# Patient Record
Sex: Female | Born: 1975 | Hispanic: Yes | Marital: Married | Smoking: Never smoker
Health system: Southern US, Community
[De-identification: ages and names within clinical notes are randomized; demographics above are authoritative.]

---

## 2005-06-27 ENCOUNTER — Ambulatory Visit: Payer: Self-pay | Admitting: Family Medicine

## 2005-11-28 ENCOUNTER — Ambulatory Visit: Payer: Self-pay | Admitting: Family Medicine

## 2005-11-28 ENCOUNTER — Inpatient Hospital Stay: Payer: Self-pay | Admitting: Obstetrics and Gynecology

## 2007-03-31 IMAGING — US US OB US >=[ID] SNGL FETUS
1 series · 17 of 28 positions shown · non-contrast
Comparison: none

REASON FOR EXAM: dates and anatomy pt at 16 wks gestation  interpreter
office informed
COMMENTS:

[Series 1: us ob us >=(id) sngl fetus · 17 of 84 slices shown]
[im 1/84]
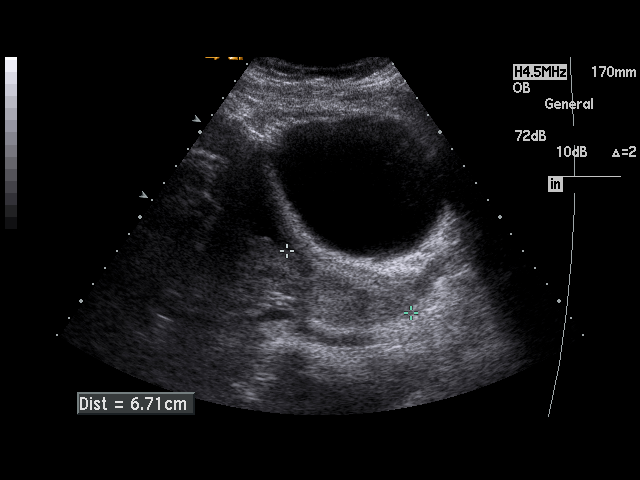
[im 7/84]
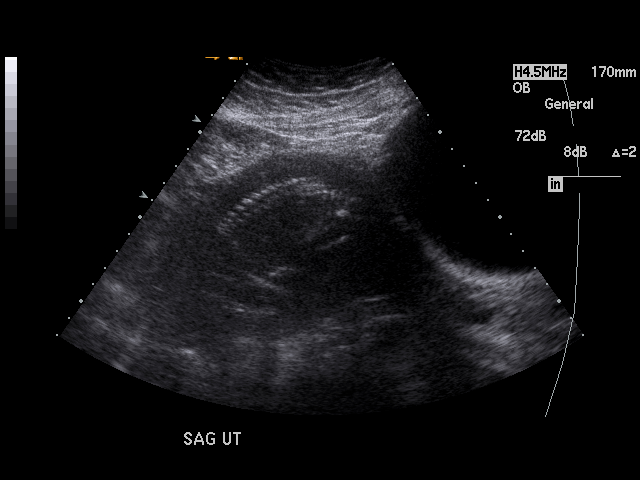
[im 13/84]
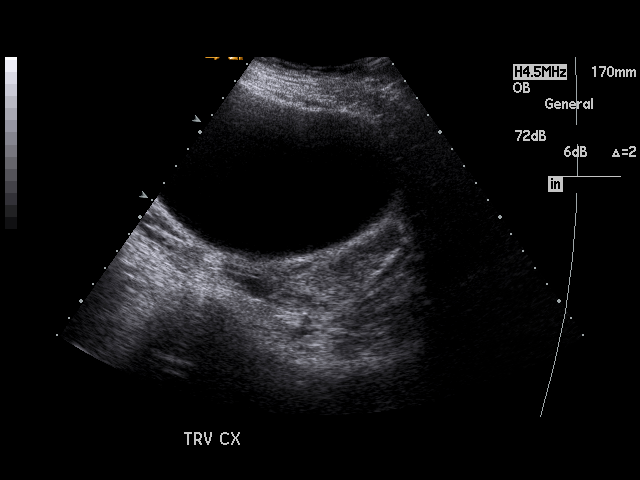
[im 16/84]
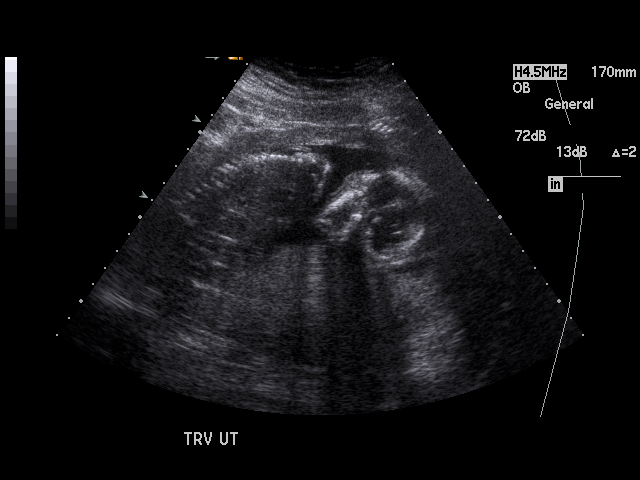
[im 22/84]
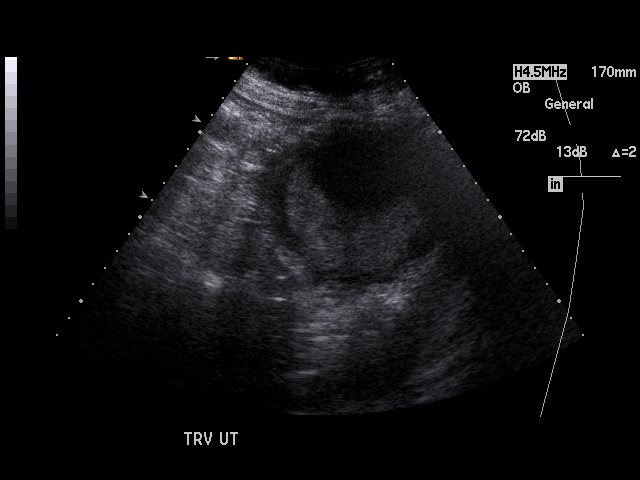
[im 28/84]
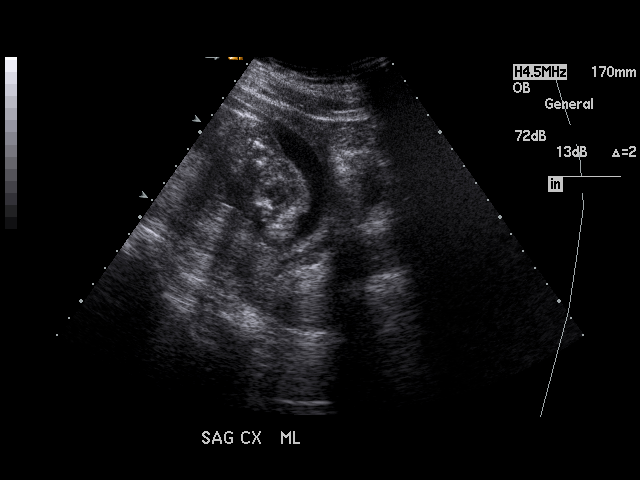
[im 31/84]
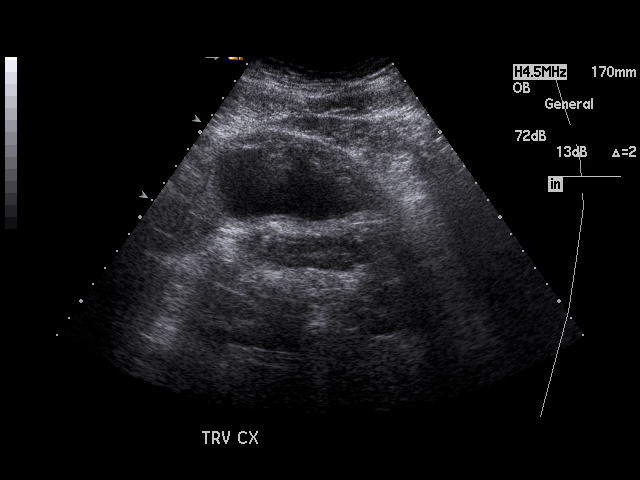
[im 37/84]
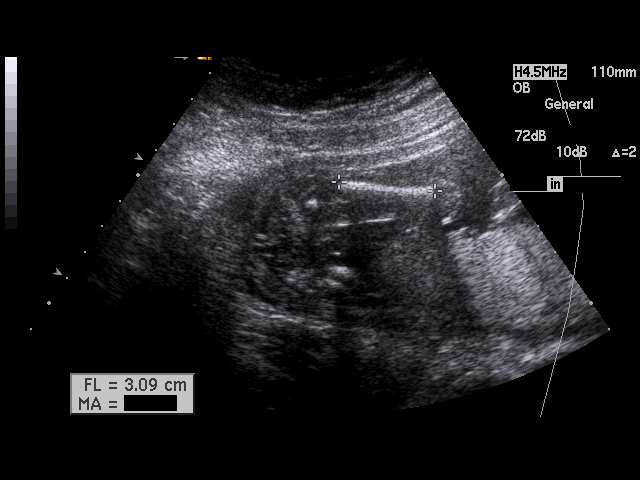
[im 44/84]
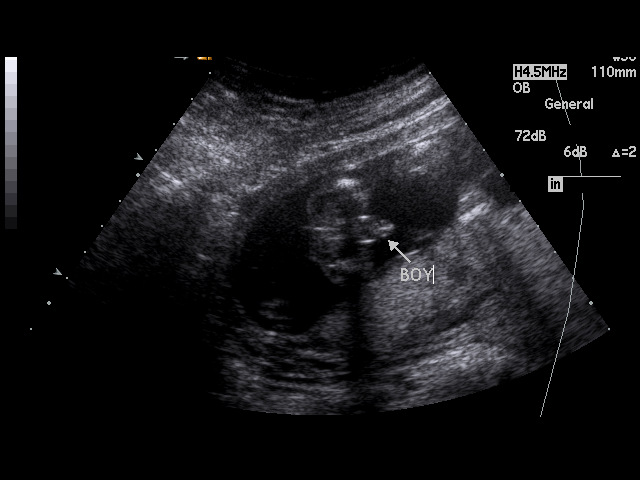
[im 47/84]
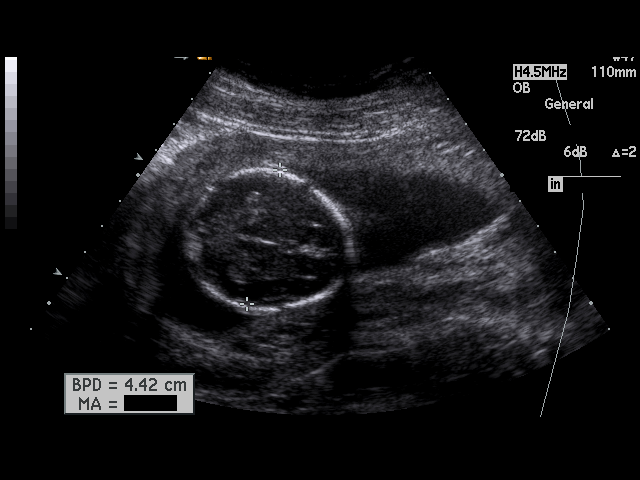
[im 53/84]
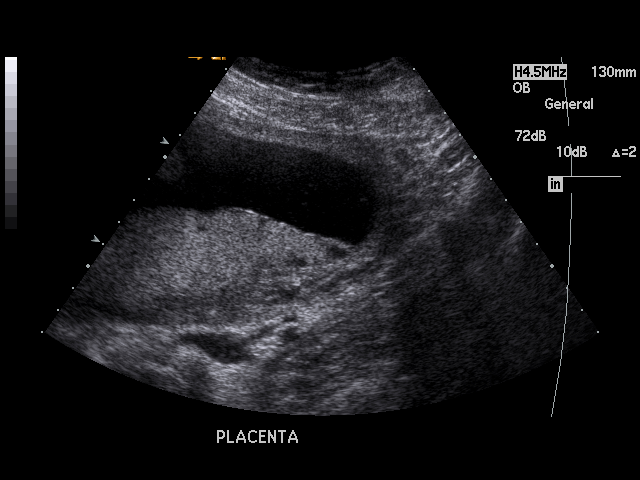
[im 56/84]
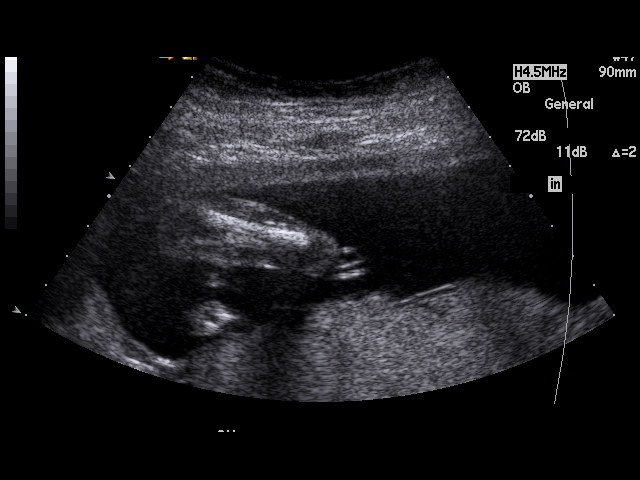
[im 62/84]
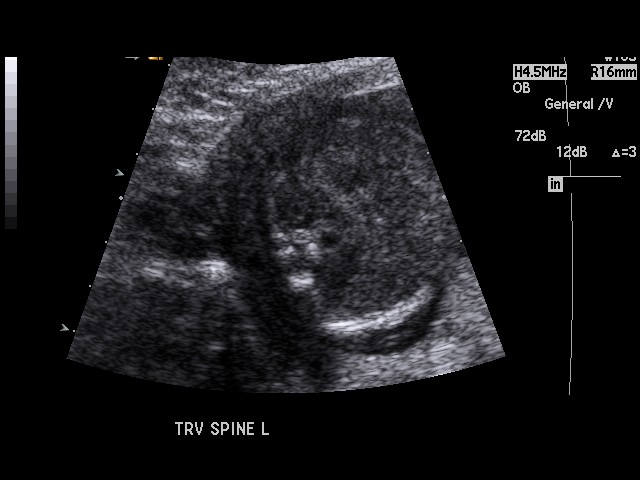
[im 68/84]
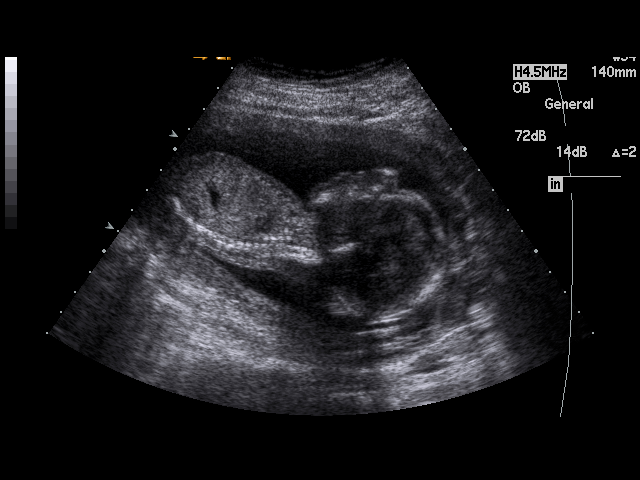
[im 71/84]
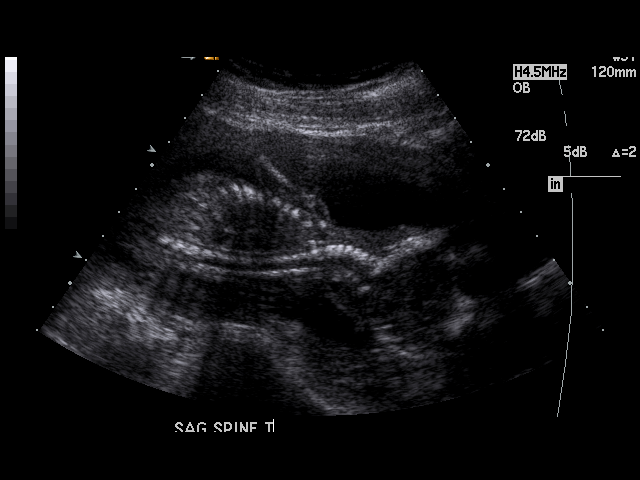
[im 77/84]
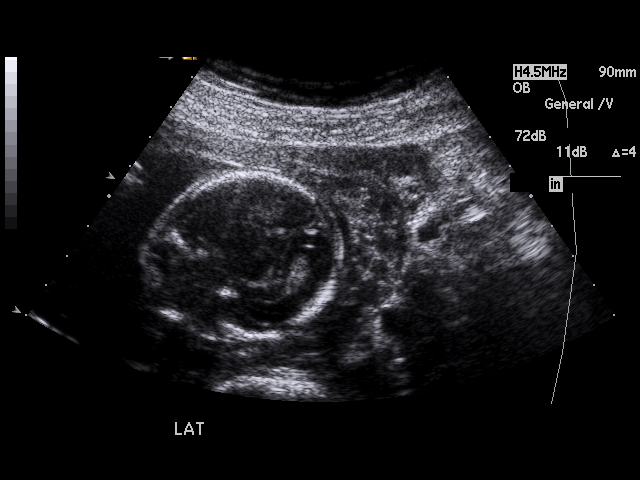
[im 84/84]
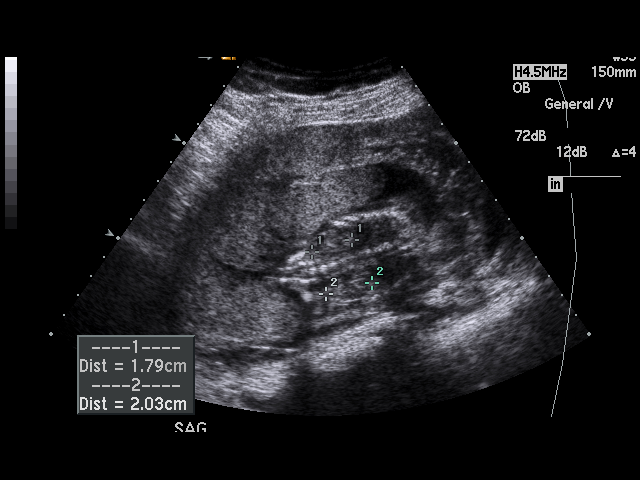

[17 of 28 positions shown; findings below may reference images not displayed]

PROCEDURE:     US  - US OB GREATER/OR EQUAL TO 0W01K  - June 27, 2005  [DATE]

RESULT:     Real time imaging was obtained. A single viable intrauterine
fetus in vertex presentation is identified. Fetal motion and fetal cardiac
motion is observed. Fetal heart rate is 138 beats per minute.  Utilizing
various growth parameters the average ultrasound age is 19 weeks-7 days.

BPD: 4.42 cm (19 weeks-9 days)
HC: 16.83 cm (19 weeks-9 days)
AC: 13.89 cm (19 weeks-7 days)
FL: 2.95 cm (19 weeks)

Fetal bladder, kidneys, stomach, four chambered heart and diaphragm are
identified.  The amniotic fluid appears within normal limits.

The placenta is located posteriorly and does not extend into the lower
uterine segment.
IMPRESSION: 1)Single viable intrauterine fetus in vertex presentation with average
ultrasound age of 19 weeks-7 days.  Heart rate of 138 beats per minute is
noted.

## 2007-09-01 IMAGING — US US OB FOLLOW-UP
1 series · 17 of 28 positions shown · non-contrast
Comparison: none

REASON FOR EXAM: Post date check AFI (interpreter office informed)
COMMENTS:

[Series 1: us ob follow-up · 17 of 28 slices shown]
[im 1/28]
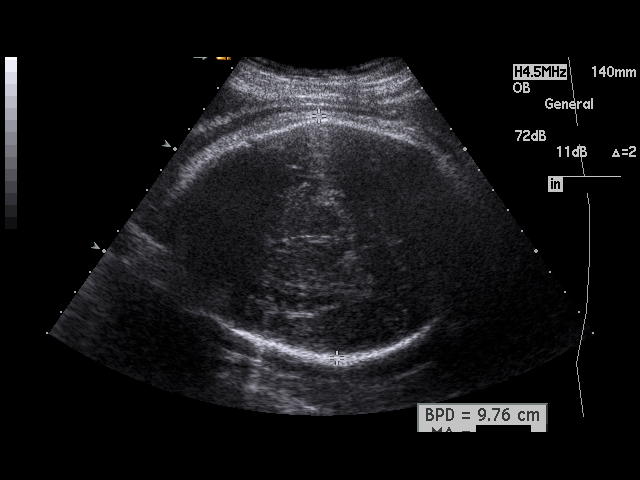
[im 3/28]
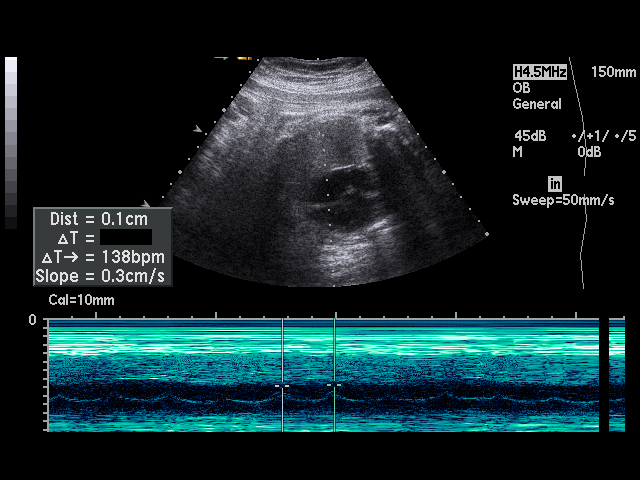
[im 5/28]
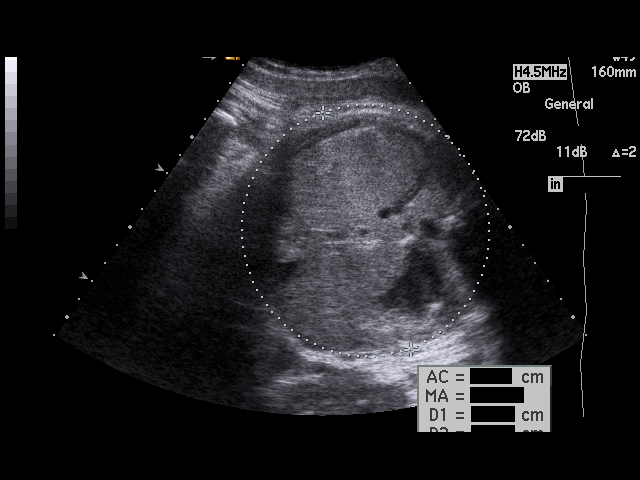
[im 6/28]
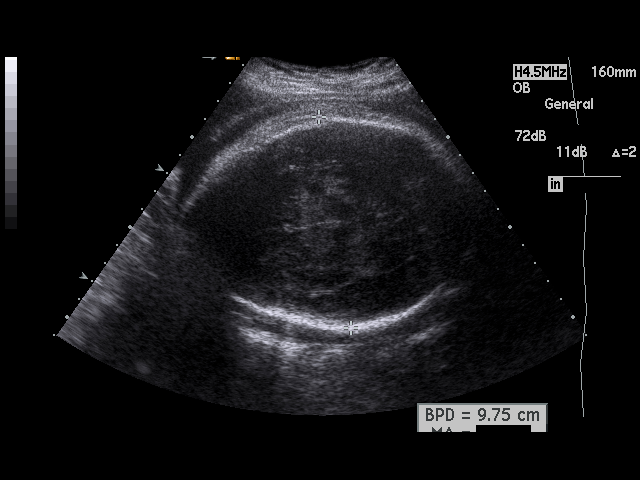
[im 8/28]
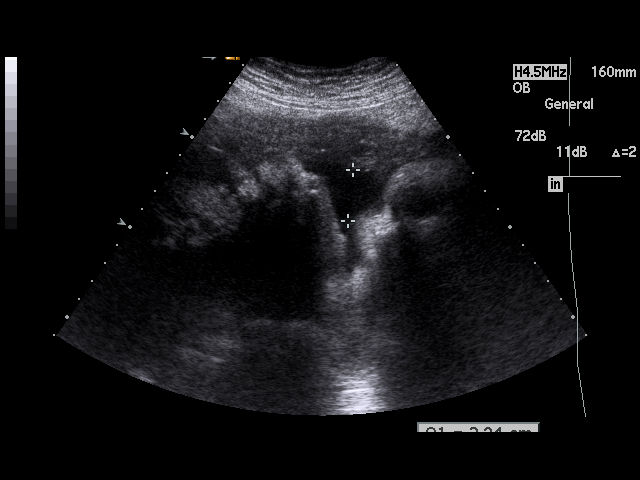
[im 10/28]
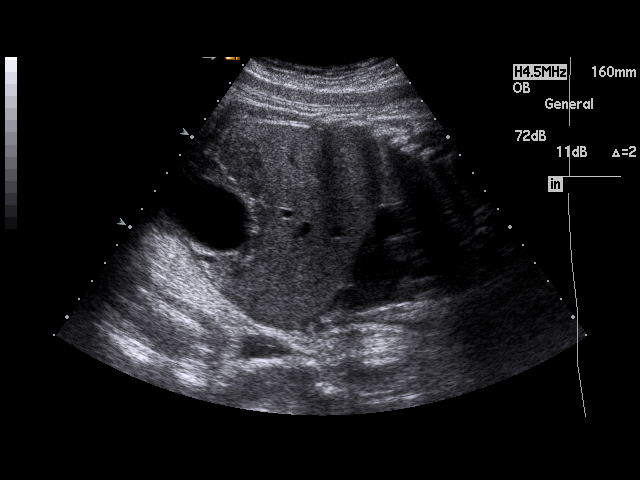
[im 11/28]
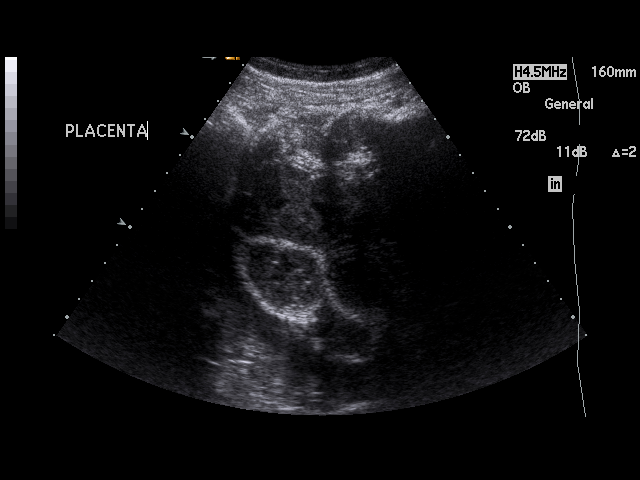
[im 13/28]
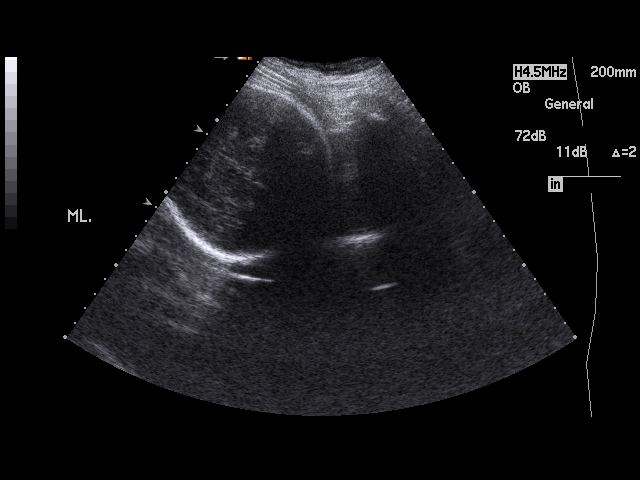
[im 15/28]
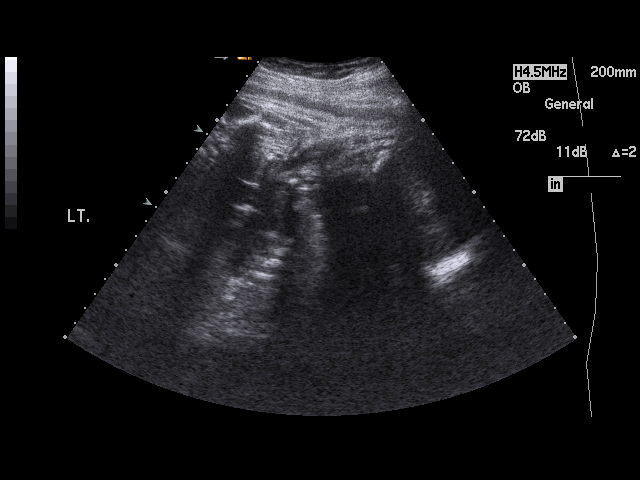
[im 16/28]
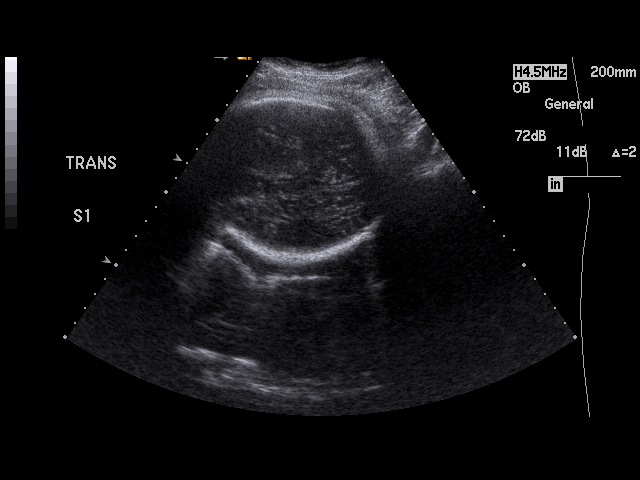
[im 18/28]
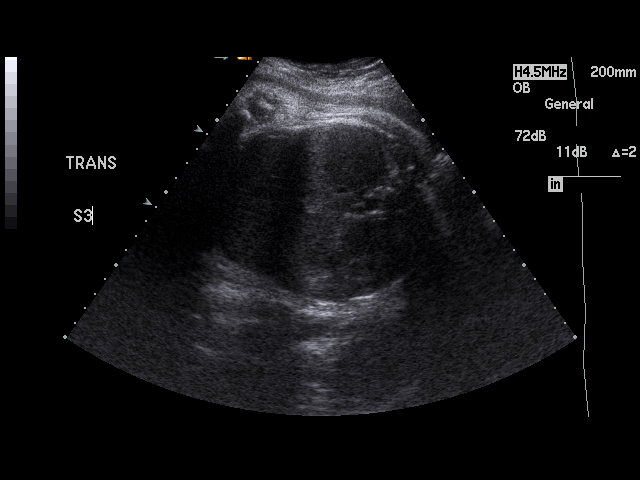
[im 19/28]
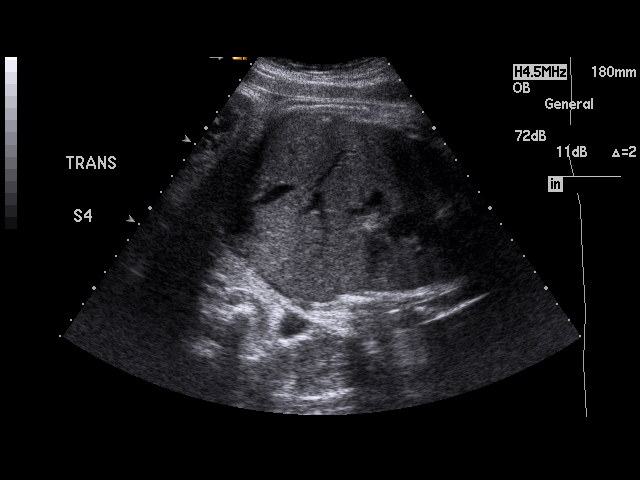
[im 21/28]
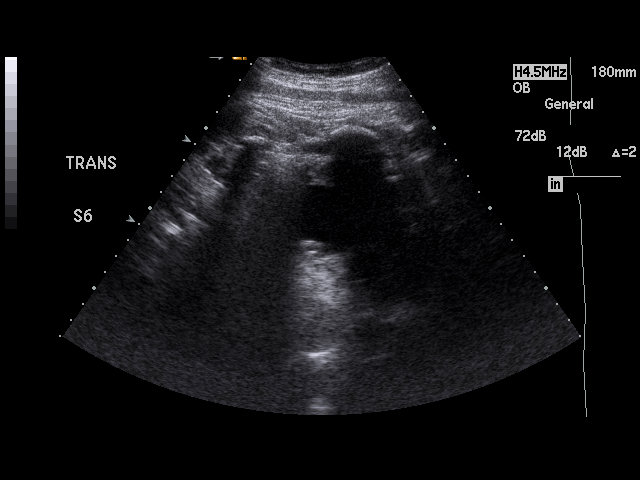
[im 23/28]
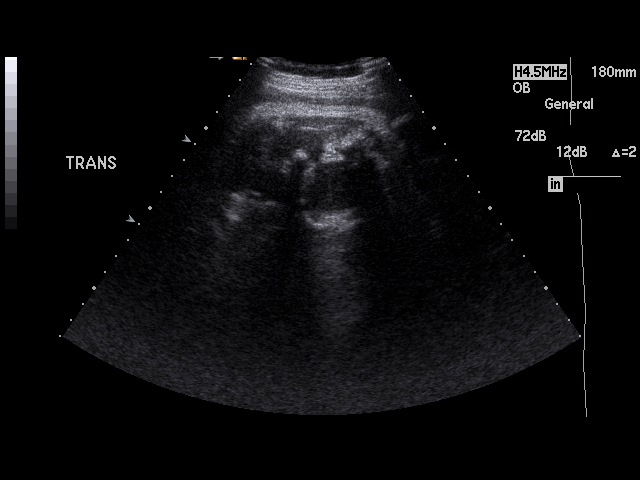
[im 24/28]
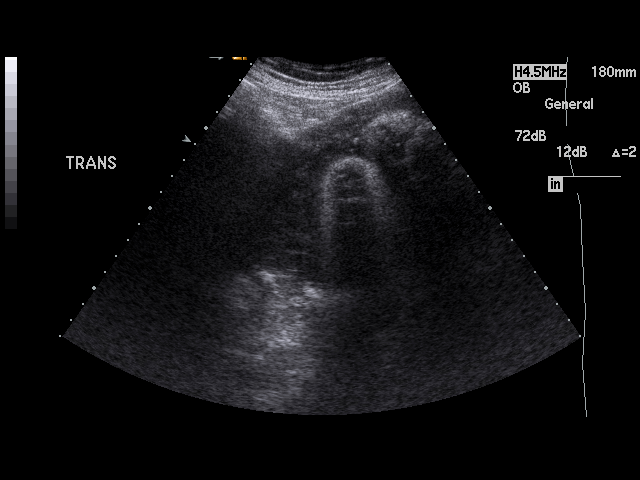
[im 26/28]
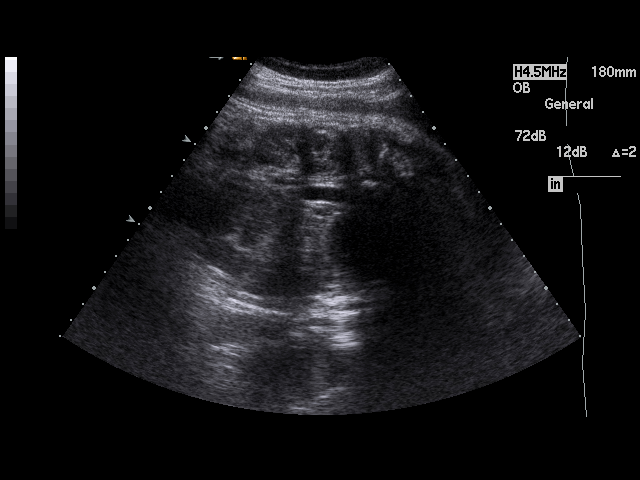
[im 28/28]
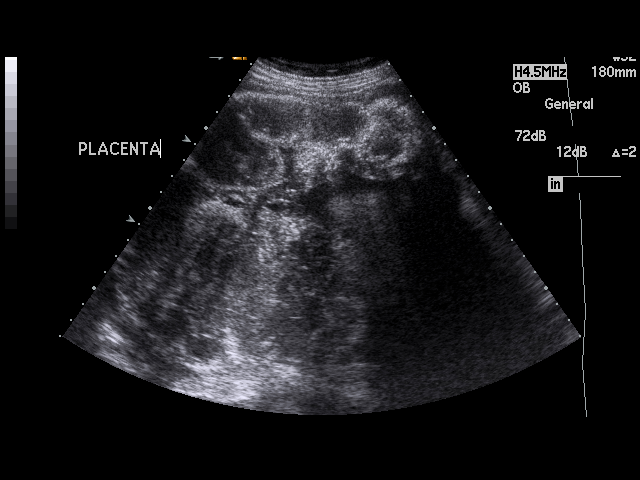

[17 of 28 positions shown; findings below may reference images not displayed]

PROCEDURE:     US  - US OB FOLLOW UP  - November 28, 2005 [DATE]

RESULT:     Follow-up OB ultrasound examination was performed for fetal age
evaluation.  A single living intrauterine gestation is observed.
Presentation is cephalic.  The placenta is posterior.  Amniotic fluid volume
is scant and appears diminished.  AFI measures 4.42 cm which is below the
2.5 percentile.  The fetal heart rate was monitored at 138 beats per minute.

Fetal measurements are as follows:
BPD 9.76 cm (40 weeks 0 days)
HC 35.02 cm (40 weeks 5 days)
AC 36.02 cm (40 weeks 0 days)
FL 7.92 cm (40 weeks 3 days)
EFW equal 421 grams.
Average ultrasound age 40 weeks 2 days.
Ultrasound EDD is 11/26/05.  Based on today's measurements, the patient is two
days overdue.
IMPRESSION: Please see above.

## 2009-05-26 ENCOUNTER — Inpatient Hospital Stay: Payer: Self-pay | Admitting: Obstetrics & Gynecology

## 2009-10-20 ENCOUNTER — Emergency Department: Payer: Self-pay | Admitting: Internal Medicine

## 2012-08-27 ENCOUNTER — Ambulatory Visit: Payer: Self-pay | Admitting: Obstetrics and Gynecology

## 2012-08-27 LAB — URINALYSIS, COMPLETE
Bacteria: NONE SEEN
Glucose,UR: NEGATIVE mg/dL (ref 0–75)
Hyaline Cast: 1
Leukocyte Esterase: NEGATIVE
Ph: 5 (ref 4.5–8.0)
Protein: NEGATIVE
RBC,UR: 4 /HPF (ref 0–5)
Squamous Epithelial: 1

## 2012-08-31 ENCOUNTER — Ambulatory Visit: Payer: Self-pay | Admitting: Obstetrics and Gynecology

## 2019-10-24 ENCOUNTER — Ambulatory Visit: Payer: Self-pay | Attending: Internal Medicine

## 2019-10-24 DIAGNOSIS — Z23 Encounter for immunization: Secondary | ICD-10-CM

## 2019-10-24 NOTE — Progress Notes (Signed)
   Covid-19 Vaccination Clinic  Name:  Iliana Hutt    MRN: [id number] DOB: Apr 19, 1976  10/24/2019  Ms. Zephyr Sausedo was observed post Covid-19 immunization for 15 minutes without incident. She was provided with Vaccine Information Sheet and instruction to access the V-Safe system.   Ms. Tamiyah Moulin was instructed to call 911 with any severe reactions post vaccine: Marland Kitchen Difficulty breathing  . Swelling of face and throat  . A fast heartbeat  . A bad rash all over body  . Dizziness and weakness   Immunizations Administered    Name Date Dose VIS Date Route   Pfizer COVID-19 Vaccine 10/24/2019  3:18 PM 0.3 mL 07/09/2019 Intramuscular   Manufacturer: [employer], [employer]   Lot: [id number]   NDC: [address]-4

## 2019-11-14 ENCOUNTER — Ambulatory Visit: Payer: Self-pay | Attending: Internal Medicine

## 2019-11-14 DIAGNOSIS — Z23 Encounter for immunization: Secondary | ICD-10-CM

## 2019-11-14 NOTE — Progress Notes (Signed)
   Covid-19 Vaccination Clinic  Name:  Brandy Lynch    MRN: [id number] DOB: Apr 13, 1976  11/14/2019  Ms. Brandy Lynch was observed post Covid-19 immunization for 15 minutes without incident. She was provided with Vaccine Information Sheet and instruction to access the V-Safe system.   Ms. Brandy Lynch was instructed to call 911 with any severe reactions post vaccine: Marland Kitchen Difficulty breathing  . Swelling of face and throat  . A fast heartbeat  . A bad rash all over body  . Dizziness and weakness   Immunizations Administered    Name Date Dose VIS Date Route   Pfizer COVID-19 Vaccine 11/14/2019  3:11 PM 0.3 mL 07/09/2019 Intramuscular   Manufacturer: [employer], [employer]   Lot: [id number]   NDC: [address]-9

## 2020-07-08 ENCOUNTER — Ambulatory Visit: Payer: Self-pay | Attending: Internal Medicine

## 2020-07-08 DIAGNOSIS — Z23 Encounter for immunization: Secondary | ICD-10-CM

## 2020-07-08 NOTE — Progress Notes (Signed)
° °  Covid-19 Vaccination Clinic  Name:  Wakisha Alberts    MRN: [id number] DOB: April 20, 1976  07/08/2020  Ms. Shalondra Wunschel was observed post Covid-19 immunization for 15 minutes without incident. She was provided with Vaccine Information Sheet and instruction to access the V-Safe system.   Ms. Ivelisse Culverhouse was instructed to call 911 with any severe reactions post vaccine:  Difficulty breathing   Swelling of face and throat   A fast heartbeat   A bad rash all over body   Dizziness and weakness   Immunizations Administered    No immunizations on file.

## 2023-02-25 ENCOUNTER — Other Ambulatory Visit: Payer: Self-pay

## 2023-02-25 ENCOUNTER — Emergency Department: Payer: Self-pay

## 2023-02-25 ENCOUNTER — Observation Stay
Admission: EM | Admit: 2023-02-25 | Discharge: 2023-02-26 | Disposition: A | Discharge status: Home / Self Care | Payer: Self-pay | Attending: Obstetrics | Admitting: Obstetrics

## 2023-02-25 ENCOUNTER — Encounter: Payer: Self-pay | Admitting: Emergency Medicine

## 2023-02-25 DIAGNOSIS — R55 Syncope and collapse: Secondary | ICD-10-CM | POA: Insufficient documentation

## 2023-02-25 DIAGNOSIS — N920 Excessive and frequent menstruation with regular cycle: Secondary | ICD-10-CM | POA: Diagnosis present

## 2023-02-25 DIAGNOSIS — D649 Anemia, unspecified: Secondary | ICD-10-CM

## 2023-02-25 DIAGNOSIS — N921 Excessive and frequent menstruation with irregular cycle: Principal | ICD-10-CM | POA: Insufficient documentation

## 2023-02-25 DIAGNOSIS — D62 Acute posthemorrhagic anemia: Secondary | ICD-10-CM | POA: Insufficient documentation

## 2023-02-25 LAB — BPAM RBC
Blood Product Expiration Date: 202409022359
Blood Product Expiration Date: 202409052359
ISSUE DATE / TIME: 202407302317
Unit Type and Rh: 5100
Unit Type and Rh: 5100

## 2023-02-25 LAB — BASIC METABOLIC PANEL
Anion gap: 9 (ref 5–15)
BUN: 11 mg/dL (ref 6–20)
CO2: 24 mmol/L (ref 22–32)
Calcium: 8.8 mg/dL — ABNORMAL LOW (ref 8.9–10.3)
Chloride: 102 mmol/L (ref 98–111)
Creatinine, Ser: 0.76 mg/dL (ref 0.44–1.00)
GFR, Estimated: >60 mL/min (ref >60)
Glucose, Bld: 151 mg/dL — ABNORMAL HIGH (ref 70–99)
Potassium: 3.7 mmol/L (ref 3.5–5.1)
Sodium: 135 mmol/L (ref 135–145)

## 2023-02-25 LAB — HCG, QUANTITATIVE, PREGNANCY: hCG, Beta Chain, Quant, S: <1 m[IU]/mL (ref <5)

## 2023-02-25 LAB — TYPE AND SCREEN
ABO/RH(D): O POS
Antibody Screen: NEGATIVE
Unit division: 0
Unit division: 0

## 2023-02-25 LAB — CBC
HCT: 24.3 % — ABNORMAL LOW (ref 36.0–46.0)
Hemoglobin: 7.6 g/dL — ABNORMAL LOW (ref 12.0–15.0)
MCH: 27.7 pg (ref 26.0–34.0)
MCHC: 31.3 g/dL (ref 30.0–36.0)
MCV: 88.7 fL (ref 80.0–100.0)
Platelets: 324 10*3/uL (ref 150–400)
RBC: 2.74 MIL/uL — ABNORMAL LOW (ref 3.87–5.11)
RDW: 13.9 % (ref 11.5–15.5)
WBC: 7.3 10*3/uL (ref 4.0–10.5)
nRBC: 0.7 % — ABNORMAL HIGH (ref 0.0–0.2)

## 2023-02-25 LAB — ABO/RH: ABO/RH(D): O POS

## 2023-02-25 LAB — PREPARE RBC (CROSSMATCH)

## 2023-02-25 MED ORDER — SODIUM CHLORIDE 0.9 % IV SOLN
10.0000 mL/h | Freq: Once | INTRAVENOUS | Status: AC
  Administered 2023-02-26: 10 mL/h via INTRAVENOUS

## 2023-02-25 NOTE — ED Notes (Signed)
Ultrasound at bedside

## 2023-02-25 NOTE — ED Triage Notes (Signed)
Patient to ED via POV for vaginal bleeding. States she has been bleeding since 7/5. Feeling weaker than normal and appears pale. Also states she has had some chest pain at home- none currently.

## 2023-02-25 NOTE — ED Provider Notes (Signed)
[facility] Provider Note    Event Date/Time   First MD Initiated Contact with Patient 02/25/23 2009     (approximate)  History   Chief Complaint: Vaginal Bleeding  HPI  Brandy Lynch is a 47 y.o. female with no significant past medical history who presents to the emergency department for weakness dizziness and feeling like she is going to pass out.  According to the patient she has been experiencing heavy menstrual cycles for the past 15 years but states they have been worse recently.  She states this months she has had intermittent vaginal bleeding since July 5 (3+ weeks).  Patient states the last few days she has been feeling very weak getting short of breath and feeling like she is going to pass out at times.  Patient denies any history of known anemia and denies any blood transfusion in the past.  Patient states she continues to have mild amount of vaginal bleeding.  Physical Exam   Triage Vital Signs: ED Triage Vitals  Encounter Vitals Group     BP 02/25/23 1829 (!) 169/93     Systolic BP Percentile --      Diastolic BP Percentile --      Pulse Rate 02/25/23 1829 96     Resp 02/25/23 1829 18     Temp 02/25/23 1829 98.8 F (37.1 C)     Temp Source 02/25/23 1829 Oral     SpO2 02/25/23 1829 100 %     Weight 02/25/23 1827 204 lb (92.5 kg)     Height 02/25/23 1827 5\' 8"  (1.727 m)     Head Circumference --      Peak Flow --      Pain Score 02/25/23 1827 0     Pain Loc --      Pain Education --      Exclude from Growth Chart --     Most recent vital signs: Vitals:   02/25/23 1829  BP: (!) 169/93  Pulse: 96  Resp: 18  Temp: 98.8 F (37.1 C)  SpO2: 100%    General: Awake, no distress.  CV:  Good peripheral perfusion.  Regular rate and rhythm  Resp:  Normal effort.  Equal breath sounds bilaterally.  Abd:  No distention.  Soft, nontender.  No rebound or guarding.  ED Results / Procedures / Treatments   EKG  EKG viewed and  interpreted by myself shows a normal sinus rhythm at 92 bpm with a narrow QRS, normal axis, normal intervals, nonspecific ST changes.  MEDICATIONS ORDERED IN ED: Medications - No data to display   IMPRESSION / MDM / ASSESSMENT AND PLAN / ED COURSE  I reviewed the triage vital signs and the nursing notes.  Patient's presentation is most consistent with acute presentation with potential threat to life or bodily function.  Patient presents emergency department for weakness lightheadedness and near syncope.  Patient states heavy menstrual cycles for 15+ years but they have been worse recently but this is the first time she has been lightheaded or feel like she is going to pass out.  Patient is lab work has resulted showing a reassuring chemistry however her CBC shows a hemoglobin of 7.6 likely explaining the patient's symptoms.  Patient denies any black or bloody stool or vomit.  We will obtain a pelvic ultrasound.  Given the ongoing bleeding and symptomatic anemia patient will likely require blood transfusion and possible admission or initiation of hormonal therapy.  Will discuss with OB/GYN  for help with treatment and disposition.  Patient has no PCP currently.  I did review the patient's labs previously the last labs we have are from 2014 in which the patient had a normal hemoglobin greater than 14.  I spoke with OB/GYN and they will be down to see the patient after the ultrasound is completed.  Patient care signed out to oncoming provider.  CRITICAL CARE Performed by: Minna Antis   Total critical care time: 30 minutes  Critical care time was exclusive of separately billable procedures and treating other patients.  Critical care was necessary to treat or prevent imminent or life-threatening deterioration.  Critical care was time spent personally by me on the following activities: development of treatment plan with patient and/or surrogate as well as nursing, discussions with  consultants, evaluation of patient's response to treatment, examination of patient, obtaining history from patient or surrogate, ordering and performing treatments and interventions, ordering and review of laboratory studies, ordering and review of radiographic studies, pulse oximetry and re-evaluation of patient's condition.   FINAL CLINICAL IMPRESSION(S) / ED DIAGNOSES   Symptomatic anemia Near syncope Menorrhagia  Note:  This document was prepared using Dragon voice recognition software and may include unintentional dictation errors.   Minna Antis, MD 02/25/23 2250

## 2023-02-26 ENCOUNTER — Encounter: Payer: Self-pay | Admitting: Obstetrics and Gynecology

## 2023-02-26 DIAGNOSIS — N920 Excessive and frequent menstruation with regular cycle: Secondary | ICD-10-CM | POA: Diagnosis present

## 2023-02-26 LAB — CBC
HCT: 27.4 % — ABNORMAL LOW (ref 36.0–46.0)
Hemoglobin: 9.2 g/dL — ABNORMAL LOW (ref 12.0–15.0)
MCH: 28.8 pg (ref 26.0–34.0)
MCHC: 33.6 g/dL (ref 30.0–36.0)
MCV: 85.9 fL (ref 80.0–100.0)
Platelets: 232 10*3/uL (ref 150–400)
RBC: 3.19 MIL/uL — ABNORMAL LOW (ref 3.87–5.11)
RDW: 14.2 % (ref 11.5–15.5)
WBC: 6.1 10*3/uL (ref 4.0–10.5)
nRBC: 0.3 % — ABNORMAL HIGH (ref 0.0–0.2)

## 2023-02-26 MED ORDER — ONDANSETRON HCL 4 MG/2ML IJ SOLN: 4.0000 mg | Freq: Four times a day (QID) | INTRAMUSCULAR | Status: DC | PRN

## 2023-02-26 MED ORDER — ACETAMINOPHEN 500 MG PO TABS: 1000.0000 mg | ORAL_TABLET | Freq: Four times a day (QID) | ORAL | Status: DC | PRN

## 2023-02-26 MED ORDER — PRENATAL MULTIVITAMIN CH: 1.0000 | ORAL_TABLET | Freq: Every day | ORAL | Status: DC

## 2023-02-26 MED ORDER — DOCUSATE SODIUM 100 MG PO CAPS: 100.0000 mg | ORAL_CAPSULE | Freq: Two times a day (BID) | ORAL | Status: AC

## 2023-02-26 MED ORDER — NORGESTIMATE-ETH ESTRADIOL 0.25-35 MG-MCG PO TABS: ORAL_TABLET | ORAL | 3 refills | Status: AC

## 2023-02-26 MED ORDER — ACETAMINOPHEN 500 MG PO TABS: 1000.0000 mg | ORAL_TABLET | Freq: Four times a day (QID) | ORAL | Status: AC | PRN

## 2023-02-26 MED ORDER — FERROUS SULFATE 325 (65 FE) MG PO TABS: 325.0000 mg | ORAL_TABLET | Freq: Two times a day (BID) | ORAL | Status: AC

## 2023-02-26 MED ORDER — ONDANSETRON HCL 4 MG PO TABS: 4.0000 mg | ORAL_TABLET | Freq: Four times a day (QID) | ORAL | Status: DC | PRN

## 2023-02-26 NOTE — H&P (Signed)
GYN History and Physical   SERVICE: Gynecology   Patient Name: Brandy Lynch Patient MRN:   [id number]  Due to language barrier, an interpreter was present during the history-taking and subsequent discussion (and for part of the physical exam) with this patient.  CC: vaginal bleeding   HPI: Brandy Lynch is a 47 y.o. [id number] with heavy and prolonged vaginal bleeding.  She started her menstrual cycle on 01/31/2023.  It started out like a normal menstrual cycle with moderate to heavy bleeding lasting 5 days. She then had 3 + weeks of irregular spotting to heavy flow.  The last 3 days have been progressively more heavy bleeding with symptoms of feeling fatigued, weak, and like she is going to pass out. Her menstrual cycles have been more irregular over the past year with several months of absent cycle followed by 5-7 days of menstrual bleeding. She denies any history of menorrhagia, bleeding disorders, or anemia.     Review of Systems: positives in bold GEN:   fevers, chills, weight changes, appetite changes, fatigue, night sweats HEENT:  HA, vision changes, hearing loss, congestion, rhinorrhea, sinus pressure, dysphagia CV:   CP, palpitations PULM:  SOB, cough GI:  abd pain, N/V/D/C GU:  dysuria, urgency, frequency MSK:  arthralgias, myalgias, back pain, swelling SKIN:  rashes, color changes, pallor NEURO:  numbness, weakness, tingling, seizures, dizziness, tremors PSYCH:  depression, anxiety, behavioral problems, confusion  HEME/LYMPH:  easy bruising or bleeding ENDO:  heat/cold intolerance  Past Obstetrical History: OB History     Gravida  2   Para  2   Term  2   Preterm      AB      Living  2      SAB      IAB      Ectopic      Multiple      Live Births  2           Past Gynecologic History: Patient's last menstrual period was 01/31/2023 (approximate). Menstrual frequency is irregular.  Can skip several months at a time.   Past Medical  History: History reviewed. No pertinent past medical history.  Past Surgical History:  History reviewed. No pertinent surgical history.  Family History:  family history is not on file.  Social History:  Social History   Socioeconomic History   Marital status: Married    Spouse name: Not on file   Number of children: Not on file   Years of education: Not on file   Highest education level: Not on file  Occupational History   Not on file  Tobacco Use   Smoking status: Not on file   Smokeless tobacco: Not on file  Substance and Sexual Activity   Alcohol use: Not on file   Drug use: Not on file   Sexual activity: Not on file  Other Topics Concern   Not on file  Social History Narrative   Not on file   Social Determinants of Health   Financial Resource Strain: Not on file  Food Insecurity: Not on file  Transportation Needs: Not on file  Physical Activity: Not on file  Stress: Not on file  Social Connections: Not on file  Intimate Partner Violence: Not on file    Home Medications:  Medications reconciled in EPIC  No current facility-administered medications on file prior to encounter.   No current outpatient medications on file prior to encounter.    Allergies:  No Known Allergies  Physical  Exam:  Temp:  [98.6 F (37 C)-98.8 F (37.1 C)] 98.7 F (37.1 C) (07/31 0142) Pulse Rate:  [78-96] 81 (07/31 0142) Resp:  [16-18] 16 (07/31 0142) BP: (116-172)/(64-93) 120/80 (07/31 0142) SpO2:  [100 %] 100 % (07/31 0125) Weight:  [92.5 kg] 92.5 kg (07/30 1827)  Physical Exam Constitutional:      Appearance: Normal appearance.  Pulmonary:     Effort: Pulmonary effort is normal.  Abdominal:     Palpations: Abdomen is soft.     Tenderness: There is no abdominal tenderness.  Genitourinary:    General: Normal vulva.     Vagina: Bleeding present.     Uterus: Not tender.      Adnexa:        Right: No tenderness.         Left: No tenderness.    Musculoskeletal:         General: Normal range of motion.     Cervical back: Normal range of motion.  Skin:    General: Skin is warm and dry.     Capillary Refill: Capillary refill takes less than 2 seconds.  Neurological:     Mental Status: She is alert and oriented to person, place, and time.  Psychiatric:        Mood and Affect: Mood normal.     Labs/Studies:   CBC and Coags:  Lab Results  Component Value Date   WBC 7.3 02/25/2023   HGB 7.6 (L) 02/25/2023   HCT 24.3 (L) 02/25/2023   MCV 88.7 02/25/2023   PLT 324 02/25/2023   CMP:  Lab Results  Component Value Date   NA 135 02/25/2023   K 3.7 02/25/2023   CL 102 02/25/2023   CO2 24 02/25/2023   BUN 11 02/25/2023   CREATININE 0.76 02/25/2023    TVUS:   US PELVIC COMPLETE WITH TRANSVAGINAL  Result Date: 02/26/2023 CLINICAL DATA:  Dysfunctional uterine bleeding EXAM: TRANSABDOMINAL AND TRANSVAGINAL ULTRASOUND OF PELVIS TECHNIQUE: Both transabdominal and transvaginal ultrasound examinations of the pelvis were performed. Transabdominal technique was performed for global imaging of the pelvis including uterus, ovaries, adnexal regions, and pelvic cul-de-sac. It was necessary to proceed with endovaginal exam following the transabdominal exam to visualize the uterus and endometrium. COMPARISON:  None Available. FINDINGS: Uterus Measurements: 10 x 6.8 x 8.1 cm = volume: 287 mL. Bulky appearance of the uterine fundus and body with heterogenous echotexture. Endometrium Thickness: 6.5 mm.  Trace fluid in the endometrial canal Right ovary Not seen Left ovary Measurements: 4.2 x 3 x 3.2 cm = volume: 13.8 mL. Normal appearance/no adnexal mass. Other findings No abnormal free fluid. IMPRESSION: 1. Bulky appearance of the uterus with heterogenous echotexture, question adenomyosis. Endometrial thickness of 6.5 mm. If bleeding remains unresponsive to hormonal or medical therapy, sonohysterogram should be considered for focal lesion work-up. (Ref: Radiological  Reasoning: Algorithmic Workup of Abnormal Vaginal Bleeding with Endovaginal Sonography and Sonohysterography. AJR 2008; 132:G40-10) 2. Nonvisualized right ovary. Electronically Signed   By: Jasmine Pang M.D.   On: 02/26/2023 01:02     Assessment / Plan:   Brandy Lynch is a 47 y.o. [id number] who presents with acute blood loss anemia related menorrhagia   Menorrhagia with irregular cycles  -Observation to 3rd floor Women's Unit  -Discussed plan of care with Dr. Feliberto Gottron  -Reg diet  -[place] today overall reassuring without thickened endometrial lining.  Possible concern for adenomyosis.   -Plan to start COC tapper - 1 tab q 8 hours for  3 days, 1 tab q 12 hours for 2 days, and then 1 tab daily, skip placebo week and start new OCP pack  -COC is not currently available in pharmacy - will check to see if it can be brought in AM.  Can consider high dose provera for 7 days followed by OCP's if unable to get in AM or if bleeding increases    Acute blood loss anemia  -2 units pRBC ordered for transfusion -Will recheck CBC in AM   ----- Margaretmary Eddy, CNM Midwife [facility], Department of OB/GYN [facility]

## 2023-02-26 NOTE — ED Notes (Signed)
Midwife at bedside

## 2023-02-26 NOTE — Discharge Summary (Signed)
DC Summary Discharge Summary   Patient ID: Brandy Lynch [id number] 47 y.o. 02/09/1976  Admit date: 02/25/2023  Discharge date: 02/26/2023  Principal Diagnoses:  menorrhagia  Procedures performed during the hospitalization:  2 units pRBC transfusion   HPI:  Brandy Lynch is a 47 y.o. [id number] with heavy and prolonged vaginal bleeding.  She started her menstrual cycle on 01/31/2023.  It started out like a normal menstrual cycle with moderate to heavy bleeding lasting 5 days. She then had 3 + weeks of irregular spotting to heavy flow.  The last 3 days have been progressively more heavy bleeding with symptoms of feeling fatigued, weak, and like she is going to pass out. Her menstrual cycles have been more irregular over the past year with several months of absent cycle followed by 5-7 days of menstrual bleeding. She denies any history of menorrhagia, bleeding disorders, or anemia.      History reviewed. No pertinent past medical history.  History reviewed. No pertinent surgical history.  No Known Allergies  Social History   Tobacco Use   Smoking status: Never   Smokeless tobacco: Never  Vaping Use   Vaping status: Never Used  Substance Use Topics   Alcohol use: Not Currently   Drug use: Not Currently    History reviewed. No pertinent family history.  Hospital Course:   CLINICAL DATA:  Dysfunctional uterine bleeding   EXAM: TRANSABDOMINAL AND TRANSVAGINAL ULTRASOUND OF PELVIS   TECHNIQUE: Both transabdominal and transvaginal ultrasound examinations of the pelvis were performed. Transabdominal technique was performed for global imaging of the pelvis including uterus, ovaries, adnexal regions, and pelvic cul-de-sac. It was necessary to proceed with endovaginal exam following the transabdominal exam to visualize the uterus and endometrium.   COMPARISON:  None Available.   FINDINGS: Uterus   Measurements: 10 x 6.8 x 8.1 cm = volume: 287 mL. Bulky  appearance of the uterine fundus and body with heterogenous echotexture.   Endometrium   Thickness: 6.5 mm.  Trace fluid in the endometrial canal   Right ovary   Not seen   Left ovary   Measurements: 4.2 x 3 x 3.2 cm = volume: 13.8 mL. Normal appearance/no adnexal mass.   Other findings   No abnormal free fluid.   IMPRESSION: 1. Bulky appearance of the uterus with heterogenous echotexture, question adenomyosis. Endometrial thickness of 6.5 mm. If bleeding remains unresponsive to hormonal or medical therapy, sonohysterogram should be considered for focal lesion work-up. (Ref: Radiological Reasoning: Algorithmic Workup of Abnormal Vaginal Bleeding with Endovaginal Sonography and Sonohysterography. AJR 2008; 638:V56-43) 2. Nonvisualized right ovary.     Electronically Signed   By: Jasmine Pang M.D.   On: 02/26/2023 01:02  Discharge Exam: BP 139/80 (BP Location: Left Arm)   Pulse 78   Temp 98.9 F (37.2 C) (Oral)   Resp 20   Ht 5\' 8"  (1.727 m)   Wt 92.5 kg   LMP 01/31/2023 (Approximate)   SpO2 98%   BMI 31.02 kg/m  Physical Exam Constitutional:      Appearance: Normal appearance.  Genitourinary:     Pelvic Tanner Score: 5/5.    Vaginal bleeding present.     Vaginal exam comments: scant.  HENT:     Head: Normocephalic and atraumatic.  Cardiovascular:     Rate and Rhythm: Normal rate and regular rhythm.     Pulses: Normal pulses.     Heart sounds: Normal heart sounds.  Pulmonary:     Effort: Pulmonary effort is normal.  Breath sounds: Normal breath sounds.  Abdominal:     Palpations: Abdomen is soft.     Tenderness: There is no abdominal tenderness.  Musculoskeletal:        General: Normal range of motion.     Cervical back: Normal range of motion and neck supple.  Neurological:     Mental Status: She is alert.  Skin:    General: Skin is warm and dry.  Psychiatric:        Mood and Affect: Mood normal.        Behavior: Behavior normal.         Thought Content: Thought content normal.        Judgment: Judgment normal.      Condition at Discharge: Stable  Complications affecting treatment: None  Discharge Medications:  Allergies as of 02/26/2023   No Known Allergies      Medication List     TAKE these medications    acetaminophen 500 MG tablet Commonly known as: TYLENOL Take 2 tablets (1,000 mg total) by mouth every 6 (six) hours as needed for mild pain.   docusate sodium 100 MG capsule Commonly known as: Colace Take 1 capsule (100 mg total) by mouth 2 (two) times daily.   ferrous sulfate 325 (65 FE) MG tablet Take 1 tablet (325 mg total) by mouth 2 (two) times daily with a meal. For anemia, take with Vitamin C   norgestimate-ethinyl estradiol 0.25-35 MG-MCG tablet Commonly known as: ORTHO-CYCLEN 1 tab q 8 hours for 3 days, 1 tab q 12 hours for 2 days, and then 1 tab daily, skip placebo week and start new OCP pack         Follow-up arrangements:   Follow-up Information     Schermerhorn, Ihor Austin, MD. Schedule an appointment as soon as possible for a visit in 1 month(s).   Specialty: Obstetrics and Gynecology Why: for menorrhagia Contact information: [address] [place] [address] [phone]                  Discharge Disposition: Discharge disposition: 01-Home or Self Care    Signed: Chari Manning [facility]  02/26/2023 12:41 PM

## 2023-02-26 NOTE — ED Notes (Signed)
ED TO INPATIENT HANDOFF REPORT  ED Nurse Name and Phone #: Gustavus Messing [phone]  S Name/Age/Gender Brandy Lynch 47 y.o. female Room/Bed: ED26A/ED26A  Code Status   Code Status: Full Code  Home/SNF/Other Home Patient oriented to: self, place, time, and situation Is this baseline? Yes   Triage Complete: Triage complete  Chief Complaint Menorrhagia [N92.0]  Triage Note Patient to ED via POV for vaginal bleeding. States she has been bleeding since 7/5. Feeling weaker than normal and appears pale. Also states she has had some chest pain at home- none currently.   Allergies No Known Allergies  Level of Care/Admitting Diagnosis ED Disposition     ED Disposition  Admit   Condition  --   Comment  Hospital Area: [facility] [[id number]]  Level of Care: Postpartum [19]  Covid Evaluation: Asymptomatic - no recent exposure (last 10 days) testing not required  Diagnosis: Menorrhagia [960454]  Admitting Physician: Suzy Bouchard [[id number]]  Attending Physician: Gustavo Lah [[id number]]          B Medical/Surgery History History reviewed. No pertinent past medical history. History reviewed. No pertinent surgical history.   A IV Location/Drains/Wounds Patient Lines/Drains/Airways Status     Active Line/Drains/Airways     Name Placement date Placement time Site Days   Peripheral IV 02/25/23 20 G Left Antecubital 02/25/23  2204  Antecubital  1            Intake/Output Last 24 hours  Intake/Output Summary (Last 24 hours) at 02/26/2023 0218 Last data filed at 02/26/2023 0125 Gross per 24 hour  Intake 420 ml  Output --  Net 420 ml    Labs/Imaging Results for orders placed or performed during the hospital encounter of 02/25/23 (from the past 48 hour(s))  CBC     Status: Abnormal   Collection Time: 02/25/23  6:29 PM  Result Value Ref Range   WBC 7.3 4.0 - 10.5 K/uL   RBC 2.74 (L) 3.87 - 5.11 MIL/uL   Hemoglobin 7.6 (L) 12.0 - 15.0  g/dL   HCT 62.1 (L) 30.8 - 65.7 %   MCV 88.7 80.0 - 100.0 fL   MCH 27.7 26.0 - 34.0 pg   MCHC 31.3 30.0 - 36.0 g/dL   RDW 84.6 96.2 - 95.2 %   Platelets 324 150 - 400 K/uL   nRBC 0.7 (H) 0.0 - 0.2 %    Comment: Performed at [facility], [address] [place], [place] [address]  Type and screen [facility]     Status: None (Preliminary result)   Collection Time: 02/25/23  6:29 PM  Result Value Ref Range   ABO/RH(D) O POS    Antibody Screen NEG    Sample Expiration 02/28/2023,2359    Unit Number [id number]    Blood Component Type RBC LR PHER2    Unit division 00    Status of Unit ISSUED    Transfusion Status OK TO TRANSFUSE    Crossmatch Result      Compatible Performed at [facility], [address] [place], [place] [address]    Unit Number [id number]    Blood Component Type RED CELLS,LR    Unit division 00    Status of Unit ISSUED    Transfusion Status OK TO TRANSFUSE    Crossmatch Result Compatible   Basic metabolic panel     Status: Abnormal   Collection Time: 02/25/23  6:29 PM  Result Value Ref Range   Sodium  135 135 - 145 mmol/L   Potassium 3.7 3.5 - 5.1 mmol/L   Chloride 102 98 - 111 mmol/L   CO2 24 22 - 32 mmol/L   Glucose, Bld 151 (H) 70 - 99 mg/dL    Comment: Glucose reference range applies only to samples taken after fasting for at least 8 hours.   BUN 11 6 - 20 mg/dL   Creatinine, Ser 1.61 0.44 - 1.00 mg/dL   Calcium 8.8 (L) 8.9 - 10.3 mg/dL   GFR, Estimated >09 >60 mL/min    Comment: (NOTE) Calculated using the CKD-EPI Creatinine Equation (2021)    Anion gap 9 5 - 15    Comment: Performed at [facility] [address] [place], [place] [address]  hCG, quantitative, pregnancy     Status: None   Collection Time: 02/25/23  6:30 PM  Result Value Ref Range   hCG, Beta Chain, Quant, S <1 <5 mIU/mL    Comment:          GEST. AGE      CONC.  (mIU/mL)   <=1 WEEK        5 - 50     2 WEEKS       50  - 500     3 WEEKS       100 - 10,000     4 WEEKS     1,000 - 30,000     5 WEEKS     3,500 - 115,000   6-8 WEEKS     12,000 - 270,000    12 WEEKS     15,000 - 220,000        FEMALE AND NON-PREGNANT FEMALE:     LESS THAN 5 mIU/mL Performed at [facility], [address] [place], [place] [address]   Prepare RBC (crossmatch)     Status: None   Collection Time: 02/25/23  9:43 PM  Result Value Ref Range   Order Confirmation      ORDER PROCESSED BY BLOOD BANK Performed at [facility], [address] [place], [place] [address]   ABO/Rh     Status: None   Collection Time: 02/25/23 10:00 PM  Result Value Ref Range   ABO/RH(D)      O POS Performed at [facility], [address] [place], [place] [address]    US PELVIC COMPLETE WITH TRANSVAGINAL  Result Date: 02/26/2023 CLINICAL DATA:  Dysfunctional uterine bleeding EXAM: TRANSABDOMINAL AND TRANSVAGINAL ULTRASOUND OF PELVIS TECHNIQUE: Both transabdominal and transvaginal ultrasound examinations of the pelvis were performed. Transabdominal technique was performed for global imaging of the pelvis including uterus, ovaries, adnexal regions, and pelvic cul-de-sac. It was necessary to proceed with endovaginal exam following the transabdominal exam to visualize the uterus and endometrium. COMPARISON:  None Available. FINDINGS: Uterus Measurements: 10 x 6.8 x 8.1 cm = volume: 287 mL. Bulky appearance of the uterine fundus and body with heterogenous echotexture. Endometrium Thickness: 6.5 mm.  Trace fluid in the endometrial canal Right ovary Not seen Left ovary Measurements: 4.2 x 3 x 3.2 cm = volume: 13.8 mL. Normal appearance/no adnexal mass. Other findings No abnormal free fluid. IMPRESSION: 1. Bulky appearance of the uterus with heterogenous echotexture, question adenomyosis. Endometrial thickness of 6.5 mm. If bleeding remains unresponsive to hormonal or medical therapy, sonohysterogram should be considered for focal  lesion work-up. (Ref: Radiological Reasoning: Algorithmic Workup of Abnormal Vaginal Bleeding with Endovaginal Sonography and Sonohysterography. AJR 2008; 086:V78-46) 2. Nonvisualized right ovary. Electronically Signed  By: Jasmine Pang M.D.   On: 02/26/2023 01:02    Pending Labs Unresulted Labs (From admission, onward)     Start     Ordered   02/26/23 0800  CBC  Once-Timed,   TIMED        02/26/23 0150            Vitals/Pain Today's Vitals   02/26/23 0128 02/26/23 0142 02/26/23 0157 02/26/23 0218  BP:  120/80 129/76   Pulse:  81 82   Resp:  16 16   Temp:  98.7 F (37.1 C) 98.3 F (36.8 C)   TempSrc:  Oral Oral   SpO2:   100%   Weight:      Height:      PainSc: 0-No pain   0-No pain    Isolation Precautions No active isolations  Medications Medications  prenatal multivitamin tablet 1 tablet (has no administration in time range)  acetaminophen (TYLENOL) tablet 1,000 mg (has no administration in time range)  ondansetron (ZOFRAN) tablet 4 mg (has no administration in time range)    Or  ondansetron (ZOFRAN) injection 4 mg (has no administration in time range)  0.9 %  sodium chloride infusion (0 mL/hr Intravenous Stopped 02/26/23 0207)    Mobility walks     Focused Assessments GU   R Recommendations: See Admitting Provider Note  Report given to:   Additional Notes: n/a

## 2023-02-26 NOTE — Progress Notes (Signed)
Patient discharged. Discharge instructions given. Patient verbalizes understanding. Transported by axillary. 

## 2023-05-13 ENCOUNTER — Ambulatory Visit: Admission: RE | Admit: 2023-05-13 | Payer: Self-pay | Source: Home / Self Care | Admitting: Obstetrics and Gynecology

## 2023-09-01 ENCOUNTER — Other Ambulatory Visit: Payer: Self-pay

## 2023-09-01 DIAGNOSIS — Z1231 Encounter for screening mammogram for malignant neoplasm of breast: Secondary | ICD-10-CM

## 2023-10-06 ENCOUNTER — Ambulatory Visit: Payer: Self-pay | Attending: Hematology and Oncology | Admitting: *Deleted

## 2023-10-06 ENCOUNTER — Ambulatory Visit
Admission: RE | Admit: 2023-10-06 | Discharge: 2023-10-06 | Disposition: A | Discharge status: Home / Self Care | Payer: Self-pay | Source: Ambulatory Visit | Attending: Obstetrics and Gynecology | Admitting: Obstetrics and Gynecology

## 2023-10-06 VITALS — BP 169/102 | Wt 211.9 lb

## 2023-10-06 DIAGNOSIS — Z1239 Encounter for other screening for malignant neoplasm of breast: Secondary | ICD-10-CM

## 2023-10-06 DIAGNOSIS — Z1231 Encounter for screening mammogram for malignant neoplasm of breast: Secondary | ICD-10-CM | POA: Insufficient documentation

## 2023-10-06 NOTE — Patient Instructions (Addendum)
 Explained breast self awareness with Brandy Lynch. Pap is indicated today. Patient is currently having AUB. Patient advised to call if stops bleeding and decides would like to have Pap smear completed. Patient is scheduled to have a total hysterectomy 01/19/2024 and a Pap smear will not longer be recommended once completed. Let her know [facility] will cover Pap smears every 3 years unless has a history of abnormal Pap smears. Referred patient to the [facility] for a screening mammogram. Appointment scheduled Monday, October 06, 2023 at 1100. Patient aware of appointment and will be there. Let patient know Delford Field will follow up with her within the next couple weeks with results by letter or phone. Brandy Lynch verbalized understanding.  Aimi Essner, Kathaleen Maser, RN 10:35 AM

## 2023-10-06 NOTE — Progress Notes (Signed)
 Brandy Lynch is a 48 y.o. female who presents to [facility] clinic today with no complaints.    Pap Smear: Pap smear not completed today. Last Pap smear was 04/02/2023 at [facility] clinic and was Unsatisfactory for evaluation with negative HPV due to AUB that was obscuring. 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 Last Pap smear result is available in Epic.   Physical exam: Breasts Breasts symmetrical. No skin abnormalities bilateral breasts. No nipple retraction bilateral breasts. No nipple discharge bilateral breasts. No lymphadenopathy. No lumps palpated bilateral breasts. No complaints of pain or tenderness on exam.   Pelvic/Bimanual Pap is indicated today. Patient is currently having AUB. Patient advised to call if stops bleeding and decides would like to have Pap smear completed. Patient is scheduled to have a total hysterectomy 01/19/2024 and a Pap smear will not longer be recommended once completed.    Smoking History: Patient has never smoked.   Patient Navigation: Patient education provided. Access to services provided for patient through [employer] program. Spanish interpreter Brandy Lynch from [facility] provided.   Colorectal Cancer Screening: Per patient has never had colonoscopy completed. No complaints today.    Breast and Cervical Cancer Risk Assessment: Patient does not have family history of breast cancer, known genetic mutations, or radiation treatment to the chest before age 50. Patient has history of cervical dysplasia. Patient has no history of being immunocompromised or DES exposure in-utero.  Risk Scores as of Encounter on 10/06/2023     [name]           5-year 1.1%   Lifetime 11.61%   This patient is [place]/Latina but has no documented birth country, so the [place] model used data from [place] patients to calculate their risk  score. Document a birth country in the Demographics activity for a more accurate score.         Last calculated by Brandy Rutherford, LPN on 2/95/6213 at 10:17 AM        A: [facility] exam without pap smear No complaints.  P: Referred patient to the [facility] for a screening mammogram. Appointment scheduled Monday, October 06, 2023 at 1100.  Brandy Heidelberg, RN 10/06/2023 10:35 AM
